# Patient Record
Sex: Male | Born: 1990 | Hispanic: Yes | Marital: Single | Smoking: Never smoker
Health system: Southern US, Community
[De-identification: ages and names within clinical notes are randomized; demographics above are authoritative.]

---

## 2020-02-20 ENCOUNTER — Other Ambulatory Visit: Payer: Self-pay

## 2020-02-20 ENCOUNTER — Ambulatory Visit
Admission: RE | Admit: 2020-02-20 | Discharge: 2020-02-20 | Disposition: A | Discharge status: Home / Self Care | Payer: Self-pay | Source: Ambulatory Visit | Attending: Emergency Medicine | Admitting: Emergency Medicine

## 2020-02-20 VITALS — BP 158/87 | HR 81 | Temp 99.0°F | Resp 18

## 2020-02-20 DIAGNOSIS — R0789 Other chest pain: Secondary | ICD-10-CM

## 2020-02-20 DIAGNOSIS — Z1152 Encounter for screening for COVID-19: Secondary | ICD-10-CM

## 2020-02-20 DIAGNOSIS — M62838 Other muscle spasm: Secondary | ICD-10-CM

## 2020-02-20 DIAGNOSIS — R739 Hyperglycemia, unspecified: Secondary | ICD-10-CM

## 2020-02-20 DIAGNOSIS — M546 Pain in thoracic spine: Secondary | ICD-10-CM

## 2020-02-20 LAB — POCT FASTING CBG KUC MANUAL ENTRY: POCT Glucose (KUC): 120 mg/dL — AB (ref 70–99)

## 2020-02-20 MED ORDER — CYCLOBENZAPRINE HCL 5 MG PO TABS: 5.0000 mg | ORAL_TABLET | Freq: Two times a day (BID) | ORAL | 0 refills | Status: AC | PRN

## 2020-02-20 MED ORDER — NAPROXEN 500 MG PO TABS: 500.0000 mg | ORAL_TABLET | Freq: Two times a day (BID) | ORAL | 0 refills | Status: AC

## 2020-02-20 NOTE — ED Provider Notes (Signed)
[facility]    CSN: [id number] Arrival date & time: 02/20/20  1844      History   Chief Complaint Chief Complaint  Patient presents with  . Cough    HPI Jorge Abbott is a 29 y.o. male  Presenting for right-sided chest and thoracic back pain.  Denies trauma, injury, numbness, neck pain, fever, bruising or rash.  Has had a mild cough with this that is nonproductive and does not affect breathing.  No palpitations, lightheadedness or dizziness.  Patient is concerned about sugars as he has a history of prediabetes and family history thereof.  No polydipsia, polyphagia, polyuria.  Denies nausea, vomiting, abdominal pain.  Has not take anything for this.  History reviewed. No pertinent past medical history.  There are no problems to display for this patient.   History reviewed. No pertinent surgical history.     Home Medications    Prior to Admission medications   Medication Sig Start Date End Date Taking? Authorizing Provider  cyclobenzaprine (FLEXERIL) 5 MG tablet Take 1 tablet (5 mg total) by mouth 2 (two) times daily as needed for up to 7 days for muscle spasms. 02/20/20 02/27/20  Hall-Potvin, [place], PA-C  naproxen (NAPROSYN) 500 MG tablet Take 1 tablet (500 mg total) by mouth 2 (two) times daily. 02/20/20   Hall-Potvin, [place], PA-C    Family History History reviewed. No pertinent family history.  Social History Social History   Tobacco Use  . Smoking status: Not on file  . Smokeless tobacco: Never Used  Substance Use Topics  . Alcohol use: Not Currently  . Drug use: Never     Allergies   Patient has no known allergies.   Review of Systems As per HPI   Physical Exam Triage Vital Signs ED Triage Vitals  Enc Vitals Group     BP      Pulse      Resp      Temp      Temp src      SpO2      Weight      Height      Head Circumference      Peak Flow      Pain Score      Pain Loc      Pain Edu?      Excl. in GC?    No data  found.  Updated Vital Signs BP (!) 158/87 (BP Location: Right Arm)   Pulse 81   Temp 99 F (37.2 C) (Oral)   Resp 18   SpO2 97%   Visual Acuity Right Eye Distance:   Left Eye Distance:   Bilateral Distance:    Right Eye Near:   Left Eye Near:    Bilateral Near:     Physical Exam Constitutional:      General: He is not in acute distress. HENT:     Head: Normocephalic and atraumatic.  Eyes:     General: No scleral icterus.    Pupils: Pupils are equal, round, and reactive to light.  Cardiovascular:     Rate and Rhythm: Normal rate.  Pulmonary:     Effort: Pulmonary effort is normal. No respiratory distress.     Breath sounds: No wheezing.  Musculoskeletal:        General: Swelling and tenderness present. Normal range of motion.     Comments: Mild swelling of right rhomboid that spares scapular winging.  No shoulder joint tenderness, chest mass or crepitus.  NVI  Skin:    Coloration: Skin is not jaundiced or pale.  Neurological:     Mental Status: He is alert and oriented to person, place, and time.      UC Treatments / Results  Labs (all labs ordered are listed, but only abnormal results are displayed) Labs Reviewed  COMPREHENSIVE METABOLIC PANEL - Abnormal; Notable for the following components:      Result Value   Glucose 105 (*)    AST 49 (*)    ALT 61 (*)    All other components within normal limits   Narrative:    Performed at:  [address] [place], [place]  [id number] Lab Director: Jolene Schimke MD, Phone:  [phone]  POCT FASTING CBG [facility] MANUAL ENTRY - Abnormal; Notable for the following components:   POCT Glucose ([facility]) 120 (*)    All other components within normal limits  NOVEL CORONAVIRUS, NAA  HEMOGLOBIN A1C   Narrative:    Performed at:  [address] [place] [address] [place], [place]  [id number] Lab Director: Jolene Schimke MD, Phone:  [phone]    EKG   Radiology No results  found.  Procedures Procedures (including critical care time)  Medications Ordered in UC Medications - No data to display  Initial Impression / Assessment and Plan / UC Course  I have reviewed the triage vital signs and the nursing notes.  Pertinent labs & imaging results that were available during my care of the patient were reviewed by me and considered in my medical decision making (see chart for details).     Patient febrile, nontoxic, and hemodynamically stable in office.  No trauma, injury, bony deformity or tenderness: Radiography deferred.  CBG 120: Patient last ate/drank 4 hours PTA.  Will obtain CMP, A1c, and have patient follow-up with PCP.  If abnormal, will start Metformin.  If normal, will continue supportive care for back and chest pain as outlined below.  Return precautions discussed, pt verbalized understanding and is agreeable to plan. Final Clinical Impressions(s) / UC Diagnoses   Final diagnoses:  Encounter for screening for COVID-19  Right-sided chest wall pain  Acute right-sided thoracic back pain  Muscle spasm  Hyperglycemia     Discharge Instructions     RICE: rest, ice, compression, elevation as needed for pain.    Heat therapy (hot compress, warm wash rag, hot showers, etc.) can help relax muscles and soothe muscle aches. Cold therapy (ice packs) can be used to help swelling both after injury and after prolonged use of areas of chronic pain/aches.  Pain medication:  500 mg Naprosyn/Aleve (naproxen) every 12 hours with food:  AVOID other NSAIDs while taking this (may have Tylenol).  May take muscle relaxer as needed for severe pain / spasm.  (This medication may cause you to become tired so it is important you do not drink alcohol or operate heavy machinery while on this medication.  Recommend your first dose to be taken before bedtime to monitor for side effects safely)  Important to follow up with specialist(s) below for further evaluation/management  if your symptoms persist or worsen.    ED Prescriptions    Medication Sig Dispense Auth. Provider   cyclobenzaprine (FLEXERIL) 5 MG tablet Take 1 tablet (5 mg total) by mouth 2 (two) times daily as needed for up to 7 days for muscle spasms. 14 tablet Hall-Potvin, [place], PA-C   naproxen (NAPROSYN) 500 MG tablet Take 1 tablet (500 mg total) by mouth 2 (two) times  daily. 30 tablet Hall-Potvin, [place], PA-C     PDMP not reviewed this encounter.   Hall-Potvin, [place], [place] 02/21/20 0845

## 2020-02-20 NOTE — Discharge Instructions (Addendum)

## 2020-02-20 NOTE — ED Triage Notes (Signed)
Pt here with cough and pain with inspiration and cough on right side starting today

## 2020-02-21 LAB — COMPREHENSIVE METABOLIC PANEL
ALT: 61 IU/L — ABNORMAL HIGH (ref 0–44)
AST: 49 IU/L — ABNORMAL HIGH (ref 0–40)
Albumin/Globulin Ratio: 1.8 (ref 1.2–2.2)
Albumin: 5.1 g/dL (ref 4.1–5.2)
Alkaline Phosphatase: 120 IU/L (ref 48–121)
BUN/Creatinine Ratio: 14 (ref 9–20)
BUN: 11 mg/dL (ref 6–20)
Bilirubin Total: 0.2 mg/dL (ref 0.0–1.2)
CO2: 27 mmol/L (ref 20–29)
Calcium: 9.3 mg/dL (ref 8.7–10.2)
Chloride: 102 mmol/L (ref 96–106)
Creatinine, Ser: 0.79 mg/dL (ref 0.76–1.27)
GFR calc Af Amer: 139 mL/min/{1.73_m2} (ref >59)
GFR calc non Af Amer: 120 mL/min/{1.73_m2} (ref >59)
Globulin, Total: 2.9 g/dL (ref 1.5–4.5)
Glucose: 105 mg/dL — ABNORMAL HIGH (ref 65–99)
Potassium: 4 mmol/L (ref 3.5–5.2)
Sodium: 140 mmol/L (ref 134–144)
Total Protein: 8 g/dL (ref 6.0–8.5)

## 2020-02-21 LAB — HEMOGLOBIN A1C
Est. average glucose Bld gHb Est-mCnc: 105 mg/dL
Hgb A1c MFr Bld: 5.3 % (ref 4.8–5.6)

## 2020-02-22 LAB — SARS-COV-2, NAA 2 DAY TAT

## 2020-02-22 LAB — NOVEL CORONAVIRUS, NAA: SARS-CoV-2, NAA: NOT DETECTED

## 2020-07-08 ENCOUNTER — Encounter: Payer: Self-pay | Admitting: *Deleted

## 2020-07-08 ENCOUNTER — Other Ambulatory Visit: Payer: Self-pay

## 2020-07-08 ENCOUNTER — Emergency Department
Admission: EM | Admit: 2020-07-08 | Discharge: 2020-07-09 | Disposition: A | Discharge status: Home / Self Care | Payer: No Typology Code available for payment source | Attending: Emergency Medicine | Admitting: Emergency Medicine

## 2020-07-08 ENCOUNTER — Emergency Department: Payer: No Typology Code available for payment source

## 2020-07-08 DIAGNOSIS — S39012A Strain of muscle, fascia and tendon of lower back, initial encounter: Secondary | ICD-10-CM | POA: Insufficient documentation

## 2020-07-08 DIAGNOSIS — Y92410 Unspecified street and highway as the place of occurrence of the external cause: Secondary | ICD-10-CM | POA: Diagnosis not present

## 2020-07-08 DIAGNOSIS — S3992XA Unspecified injury of lower back, initial encounter: Secondary | ICD-10-CM | POA: Diagnosis present

## 2020-07-08 MED ORDER — MELOXICAM 15 MG PO TABS: 15.0000 mg | ORAL_TABLET | Freq: Every day | ORAL | 0 refills | Status: AC

## 2020-07-08 MED ORDER — MELOXICAM 7.5 MG PO TABS
15.0000 mg | ORAL_TABLET | Freq: Once | ORAL | Status: AC
  Administered 2020-07-09: 15 mg via ORAL
  Filled 2020-07-08: qty 2

## 2020-07-08 MED ORDER — METHOCARBAMOL 500 MG PO TABS
750.0000 mg | ORAL_TABLET | Freq: Once | ORAL | Status: AC
  Administered 2020-07-09: 750 mg via ORAL
  Filled 2020-07-08: qty 2

## 2020-07-08 MED ORDER — ACETAMINOPHEN 325 MG PO TABS
650.0000 mg | ORAL_TABLET | Freq: Once | ORAL | Status: AC
  Administered 2020-07-09: 650 mg via ORAL
  Filled 2020-07-08: qty 2

## 2020-07-08 MED ORDER — METHOCARBAMOL 750 MG PO TABS: 750.0000 mg | ORAL_TABLET | Freq: Four times a day (QID) | ORAL | 0 refills | Status: AC | PRN
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
## 2020-09-07 ENCOUNTER — Emergency Department: Payer: Self-pay

## 2020-09-07 ENCOUNTER — Other Ambulatory Visit: Payer: Self-pay

## 2020-09-07 ENCOUNTER — Encounter: Payer: Self-pay | Admitting: Radiology

## 2020-09-07 DIAGNOSIS — R0789 Other chest pain: Secondary | ICD-10-CM | POA: Insufficient documentation

## 2020-09-07 LAB — CBC
HCT: 44.5 % (ref 39.0–52.0)
Hemoglobin: 16.1 g/dL (ref 13.0–17.0)
MCH: 31.9 pg (ref 26.0–34.0)
MCHC: 36.2 g/dL — ABNORMAL HIGH (ref 30.0–36.0)
MCV: 88.3 fL (ref 80.0–100.0)
Platelets: 191 10*3/uL (ref 150–400)
RBC: 5.04 MIL/uL (ref 4.22–5.81)
RDW: 11.5 % (ref 11.5–15.5)
WBC: 8.6 10*3/uL (ref 4.0–10.5)
nRBC: 0 % (ref 0.0–0.2)

## 2020-09-07 NOTE — ED Notes (Signed)
FIRST NURSE NOTE: ambulatory to desk c/o chest pain. Pt alert and oriented X4, cooperative, RR even and unlabored, color WNL. Pt in NAD.

## 2020-09-07 NOTE — ED Triage Notes (Addendum)
Pt states was lying down tonight when he began to experience chest pain. Pt states is on left side. Pt with nausea, shortness of breath. Pt denies pain radiation. Assist of stratus spanish interpreter used [phone] [employer]

## 2020-09-08 ENCOUNTER — Emergency Department
Admission: EM | Admit: 2020-09-08 | Discharge: 2020-09-08 | Disposition: A | Discharge status: Home / Self Care | Payer: Self-pay | Attending: Emergency Medicine | Admitting: Emergency Medicine

## 2020-09-08 DIAGNOSIS — R079 Chest pain, unspecified: Secondary | ICD-10-CM

## 2020-09-08 LAB — LIPID PANEL
Cholesterol: 205 mg/dL — ABNORMAL HIGH (ref 0–200)
HDL: 58 mg/dL (ref >40)
LDL Cholesterol: 133 mg/dL — ABNORMAL HIGH (ref 0–99)
Total CHOL/HDL Ratio: 3.5 RATIO
Triglycerides: 72 mg/dL (ref <150)
VLDL: 14 mg/dL (ref 0–40)

## 2020-09-08 LAB — BASIC METABOLIC PANEL
Anion gap: 11 (ref 5–15)
BUN: 17 mg/dL (ref 6–20)
CO2: 22 mmol/L (ref 22–32)
Calcium: 9.4 mg/dL (ref 8.9–10.3)
Chloride: 106 mmol/L (ref 98–111)
Creatinine, Ser: 0.76 mg/dL (ref 0.61–1.24)
GFR, Estimated: >60 mL/min (ref >60)
Glucose, Bld: 101 mg/dL — ABNORMAL HIGH (ref 70–99)
Potassium: 3.6 mmol/L (ref 3.5–5.1)
Sodium: 139 mmol/L (ref 135–145)

## 2020-09-08 LAB — TROPONIN I (HIGH SENSITIVITY)
Troponin I (High Sensitivity): 4 ng/L (ref <18)
Troponin I (High Sensitivity): 4 ng/L (ref <18)

## 2020-09-08 MED ORDER — ONDANSETRON 4 MG PO TBDP: 4.0000 mg | ORAL_TABLET | Freq: Three times a day (TID) | ORAL | 0 refills | Status: DC | PRN

## 2020-09-08 MED ORDER — FAMOTIDINE 20 MG PO TABS: 20.0000 mg | ORAL_TABLET | Freq: Two times a day (BID) | ORAL | 0 refills | Status: AC

## 2020-09-08 NOTE — ED Provider Notes (Signed)
[facility] Emergency Department Provider Note  ____________________________________________  Time seen: Approximately 3:23 AM  I have reviewed the triage vital signs and the nursing notes.   HISTORY  Chief Complaint Chest Pain  Patient is primarily Spanish-speaking.  Friend at bedside provided interpretation at patient's request.  HPI Jorge Abbott is a 30 y.o. male with no significant past medical history who comes ED complaining of chest pain  started about 9:00 PM tonight.  Occurred while at rest lying down, located on the left side, nonradiating.  No diaphoresis vomiting or shortness of breath.  Not exertional, not pleuritic.  Intermittent lasting a few seconds at a time.  Friend at bedside notes that they eat a lot of spicy food.     History reviewed. No pertinent past medical history.   There are no problems to display for this patient.    Past surgical history negative   Prior to Admission medications   Medication Sig Start Date End Date Taking? Authorizing Provider  famotidine (PEPCID) 20 MG tablet Take 1 tablet (20 mg total) by mouth 2 (two) times daily. 09/08/20  Yes Sharman Cheek, MD  ondansetron (ZOFRAN ODT) 4 MG disintegrating tablet Take 1 tablet (4 mg total) by mouth every 8 (eight) hours as needed for nausea or vomiting. 09/08/20  Yes Sharman Cheek, MD  naproxen (NAPROSYN) 500 MG tablet Take 1 tablet (500 mg total) by mouth 2 (two) times daily. 02/20/20   Hall-Potvin, [place], PA-C     Allergies Patient has no known allergies.   No family history on file.  Social History Social History   Tobacco Use  . Smoking status: Never Smoker  . Smokeless tobacco: Never Used  Substance Use Topics  . Alcohol use: Not Currently  . Drug use: Never    Review of Systems  Constitutional:   No fever or chills.  ENT:   No sore throat. No rhinorrhea. Cardiovascular: Positive chest pain as above without syncope. Respiratory:   No  dyspnea or cough. Gastrointestinal:   Negative for abdominal pain, vomiting and diarrhea.  Musculoskeletal:   Negative for focal pain or swelling All other systems reviewed and are negative except as documented above in ROS and HPI.  ____________________________________________   PHYSICAL EXAM:  VITAL SIGNS: ED Triage Vitals [09/07/20 2327]  Enc Vitals Group     BP (!) 144/90     Pulse Rate 86     Resp 16     Temp 97.9 F (36.6 C)     Temp Source Oral     SpO2 100 %     Weight 130 lb (59 kg)     Height 5\' 5"  (1.651 m)     Head Circumference      Peak Flow      Pain Score 6     Pain Loc      Pain Edu?      Excl. in GC?     Vital signs reviewed, nursing assessments reviewed.   Constitutional:   Alert and oriented. Non-toxic appearance. Eyes:   Conjunctivae are normal. EOMI. PERRL. ENT      Head:   Normocephalic and atraumatic.      Nose:   Wearing a mask.      Mouth/Throat:   Wearing a mask.      Neck:   No meningismus. Full ROM. Hematological/Lymphatic/Immunilogical:   No cervical lymphadenopathy. Cardiovascular:   RRR. Symmetric bilateral radial and DP pulses.  No murmurs. Cap refill less than 2 seconds. Respiratory:  Normal respiratory effort without tachypnea/retractions. Breath sounds are clear and equal bilaterally. No wheezes/rales/rhonchi. Gastrointestinal:   Soft and nontender. Non distended. There is no CVA tenderness.  No rebound, rigidity, or guarding. Genitourinary:   deferred Musculoskeletal:   Normal range of motion in all extremities. No joint effusions.  No lower extremity tenderness.  No edema. Neurologic:   Normal speech and language.  Motor grossly intact. No acute focal neurologic deficits are appreciated.  Skin:    Skin is warm, dry and intact. No rash noted.  No petechiae, purpura, or bullae.  ____________________________________________    LABS (pertinent positives/negatives) (all labs ordered are listed, but only abnormal results are  displayed) Labs Reviewed  BASIC METABOLIC PANEL - Abnormal; Notable for the following components:      Result Value   Glucose, Bld 101 (*)    All other components within normal limits  CBC - Abnormal; Notable for the following components:   MCHC 36.2 (*)    All other components within normal limits  LIPID PANEL - Abnormal; Notable for the following components:   Cholesterol 205 (*)    LDL Cholesterol 133 (*)    All other components within normal limits  TROPONIN I (HIGH SENSITIVITY)  TROPONIN I (HIGH SENSITIVITY)   ____________________________________________   EKG  Interpreted by me Normal sinus rhythm rate of 89, normal axis and intervals.  Normal QRS ST segments and T waves.  ____________________________________________    RADIOLOGY  DG Chest 2 View  Result Date: 09/07/2020 CLINICAL DATA:  Chest pain.  Nausea and shortness of breath. EXAM: CHEST - 2 VIEW COMPARISON:  None. FINDINGS: The cardiomediastinal contours are normal. The lungs are clear. Pulmonary vasculature is normal. No consolidation, pleural effusion, or pneumothorax. No acute osseous abnormalities are seen. IMPRESSION: Negative radiographs of the chest. Electronically Signed   By: Narda Rutherford M.D.   On: 09/07/2020 23:57    ____________________________________________   PROCEDURES Procedures  ____________________________________________    CLINICAL IMPRESSION / ASSESSMENT AND PLAN / ED COURSE  Medications ordered in the ED: Medications - No data to display  Pertinent labs & imaging results that were available during my care of the patient were reviewed by me and considered in my medical decision making (see chart for details).  Jorge Abbott was evaluated in Emergency Department on 09/08/2020 for the symptoms described in the history of present illness. He was evaluated in the context of the global COVID-19 pandemic, which necessitated consideration that the patient might be at risk for infection  with the SARS-CoV-2 virus that causes COVID-19. Institutional protocols and algorithms that pertain to the evaluation of patients at risk for COVID-19 are in a state of rapid change based on information released by regulatory bodies including the CDC and federal and state organizations. These policies and algorithms were followed during the patient's care in the ED.   Patient presents with atypical chest pain.  Suspect GERD.  He is nontoxic, EKG and vital signs are normal.  Labs normal with troponin negative x2, exam benign.  Stable for discharge home with trial of Pepcid.   Considering the patient's symptoms, medical history, and physical examination today, I have low suspicion for ACS, PE, TAD, pneumothorax, carditis, mediastinitis, pneumonia, CHF, or sepsis.        ____________________________________________   FINAL CLINICAL IMPRESSION(S) / ED DIAGNOSES    Final diagnoses:  Nonspecific chest pain     ED Discharge Orders         Ordered    famotidine (PEPCID)  20 MG tablet  2 times daily        09/08/20 0322    ondansetron (ZOFRAN ODT) 4 MG disintegrating tablet  Every 8 hours PRN        09/08/20 0322          Portions of this note were generated with dragon dictation software. Dictation errors may occur despite best attempts at proofreading.   Sharman Cheek, MD 09/08/20 [phone]

## 2020-10-04 ENCOUNTER — Ambulatory Visit: Payer: Self-pay

## 2021-01-10 ENCOUNTER — Ambulatory Visit (INDEPENDENT_AMBULATORY_CARE_PROVIDER_SITE_OTHER): Payer: Self-pay

## 2021-01-10 ENCOUNTER — Ambulatory Visit (HOSPITAL_COMMUNITY)
Admission: EM | Admit: 2021-01-10 | Discharge: 2021-01-10 | Disposition: A | Discharge status: Home / Self Care | Payer: Self-pay | Attending: Internal Medicine | Admitting: Internal Medicine

## 2021-01-10 ENCOUNTER — Encounter (HOSPITAL_COMMUNITY): Payer: Self-pay | Admitting: *Deleted

## 2021-01-10 ENCOUNTER — Other Ambulatory Visit: Payer: Self-pay

## 2021-01-10 DIAGNOSIS — F1010 Alcohol abuse, uncomplicated: Secondary | ICD-10-CM | POA: Insufficient documentation

## 2021-01-10 DIAGNOSIS — Z20822 Contact with and (suspected) exposure to covid-19: Secondary | ICD-10-CM | POA: Insufficient documentation

## 2021-01-10 DIAGNOSIS — R0602 Shortness of breath: Secondary | ICD-10-CM | POA: Insufficient documentation

## 2021-01-10 DIAGNOSIS — R079 Chest pain, unspecified: Secondary | ICD-10-CM

## 2021-01-10 DIAGNOSIS — R0789 Other chest pain: Secondary | ICD-10-CM | POA: Insufficient documentation

## 2021-01-10 DIAGNOSIS — F1490 Cocaine use, unspecified, uncomplicated: Secondary | ICD-10-CM | POA: Insufficient documentation

## 2021-01-10 LAB — SARS CORONAVIRUS 2 (TAT 6-24 HRS): SARS Coronavirus 2: NEGATIVE

## 2021-01-10 LAB — COMPREHENSIVE METABOLIC PANEL
ALT: 62 U/L — ABNORMAL HIGH (ref 0–44)
AST: 46 U/L — ABNORMAL HIGH (ref 15–41)
Albumin: 4.3 g/dL (ref 3.5–5.0)
Alkaline Phosphatase: 91 U/L (ref 38–126)
Anion gap: 7 (ref 5–15)
BUN: 11 mg/dL (ref 6–20)
CO2: 28 mmol/L (ref 22–32)
Calcium: 9.1 mg/dL (ref 8.9–10.3)
Chloride: 105 mmol/L (ref 98–111)
Creatinine, Ser: 0.78 mg/dL (ref 0.61–1.24)
GFR, Estimated: >60 mL/min (ref >60)
Glucose, Bld: 141 mg/dL — ABNORMAL HIGH (ref 70–99)
Potassium: 3.3 mmol/L — ABNORMAL LOW (ref 3.5–5.1)
Sodium: 140 mmol/L (ref 135–145)
Total Bilirubin: 0.8 mg/dL (ref 0.3–1.2)
Total Protein: 7.5 g/dL (ref 6.5–8.1)

## 2021-01-10 LAB — D-DIMER, QUANTITATIVE: D-Dimer, Quant: 0.43 ug/mL-FEU (ref 0.00–0.50)

## 2021-01-10 MED ORDER — ACETAMINOPHEN 500 MG PO TABS: 500.0000 mg | ORAL_TABLET | Freq: Four times a day (QID) | ORAL | 0 refills | Status: AC | PRN

## 2021-01-10 MED ORDER — TIZANIDINE HCL 4 MG PO TABS: 4.0000 mg | ORAL_TABLET | Freq: Three times a day (TID) | ORAL | 0 refills | Status: DC | PRN

## 2021-01-10 NOTE — ED Triage Notes (Signed)
Pt reports he will be sound asleep and jump during his sleep. Pt also reports he sees green spots on his Chest and has had CP for last 2 days.Marland Kitchen

## 2021-01-10 NOTE — ED Provider Notes (Signed)
[facility]   MRN: [id number] DOB: 1990-12-08  Subjective:   Jorge Abbott is a 30 y.o. male presenting for 1 week history of persistent intermittent shortness of breath, 4 day history of alternating right and left sided chest pain.  Denies fever, nausea, vomiting, cough, abdominal pain, body aches, chills.  Patient drinks heavily 1 to 2 days out of the week.  Uses cocaine intermittently, last use was 3 weeks ago.  Denies history of respiratory disorders such as asthma or COPD.  Denies taking chronic medications.    No Known Allergies  History reviewed. No pertinent past medical history.   History reviewed. No pertinent surgical history.  History reviewed. No pertinent family history.  Social History   Tobacco Use   Smoking status: Never   Smokeless tobacco: Never  Substance Use Topics   Alcohol use: Not Currently   Drug use: Never    ROS   Objective:   Vitals: BP 133/84   Pulse 89   Temp 98.6 F (37 C) (Oral)   Resp 18   SpO2 94%   Physical Exam Constitutional:      General: He is not in acute distress.    Appearance: Normal appearance. He is well-developed. He is not ill-appearing, toxic-appearing or diaphoretic.  HENT:     Head: Normocephalic and atraumatic.     Right Ear: External ear normal.     Left Ear: External ear normal.     Nose: Nose normal.     Mouth/Throat:     Mouth: Mucous membranes are moist.     Pharynx: Oropharynx is clear.  Eyes:     General: No scleral icterus.    Extraocular Movements: Extraocular movements intact.     Pupils: Pupils are equal, round, and reactive to light.  Cardiovascular:     Rate and Rhythm: Normal rate and regular rhythm.     Heart sounds: Normal heart sounds. No murmur heard.   No friction rub. No gallop.  Pulmonary:     Effort: Pulmonary effort is normal. No respiratory distress.     Breath sounds: Normal breath sounds. No stridor. No wheezing, rhonchi or rales.  Neurological:      Mental Status: He is alert and oriented to person, place, and time.  Psychiatric:        Mood and Affect: Mood normal.        Behavior: Behavior normal.        Thought Content: Thought content normal.    ED ECG REPORT   Date: 01/10/2021  Rate: 77bpm  Rhythm: normal sinus rhythm  QRS Axis: normal  Intervals: normal  ST/T Wave abnormalities: nonspecific T wave changes  Conduction Disutrbances:none  Narrative Interpretation: Sinus rhythm at 77 bpm with nonspecific T wave inversion in lead III, T wave flattening in lead aVF.  Unchanged from previous EKG 09/08/2020.  Old EKG Reviewed: unchanged  I have personally reviewed the EKG tracing and agree with the computerized printout as noted.  DG Chest 2 View  Result Date: 01/10/2021 CLINICAL DATA:  Chest pain EXAM: CHEST - 2 VIEW COMPARISON:  09/07/2020 FINDINGS: The heart size and mediastinal contours are within normal limits. Both lungs are clear. The visualized skeletal structures are unremarkable. IMPRESSION: No active cardiopulmonary disease. Electronically Signed   By: Charlett Nose M.D.   On: 01/10/2021 12:43     Assessment and Plan :   PDMP not reviewed this encounter.  1. Atypical chest pain   2. Shortness of breath  3. Alcohol consumption binge drinking   4. Cocaine use     Patient is Wells criteria is low but ultimately he does have chest pain and shortness of breath and will pursue D-dimer.  Counseled him against binge drinking alcohol.  Also counseled against drug use.  Labs pending, recommended starting Tylenol and tizanidine for musculoskeletal type pain.  We will follow-up results as soon as possible. Counseled patient on potential for adverse effects with medications prescribed/recommended today, ER and return-to-clinic precautions discussed, patient verbalized understanding.    Wallis Bamberg, PA-C 01/10/21 1258
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
## 2021-09-05 ENCOUNTER — Other Ambulatory Visit: Payer: Self-pay

## 2021-09-05 ENCOUNTER — Ambulatory Visit
Admission: RE | Admit: 2021-09-05 | Discharge: 2021-09-05 | Disposition: A | Discharge status: Home / Self Care | Payer: Self-pay | Source: Ambulatory Visit | Attending: Physician Assistant | Admitting: Physician Assistant

## 2021-09-05 VITALS — BP 137/84 | HR 66 | Temp 97.9°F | Resp 18

## 2021-09-05 DIAGNOSIS — R079 Chest pain, unspecified: Secondary | ICD-10-CM

## 2021-09-05 MED ORDER — CYCLOBENZAPRINE HCL 10 MG PO TABS: 10.0000 mg | ORAL_TABLET | Freq: Two times a day (BID) | ORAL | 0 refills | Status: AC | PRN

## 2021-09-05 MED ORDER — OMEPRAZOLE 20 MG PO CPDR: 20.0000 mg | DELAYED_RELEASE_CAPSULE | Freq: Every day | ORAL | 0 refills | Status: AC

## 2021-09-05 NOTE — ED Triage Notes (Signed)
3 day h/o intermittent "stabbing" left sided chest pain. Pt reports when the pain occurs its feels more like pressure. Pain last for like half a second before resolving independently. Pain occurs about once every hr. ?CP does not radiate into extremities. No meds taken.  ?

## 2021-09-05 NOTE — Discharge Instructions (Addendum)
?  Please make appointment to establish care with Primary Care Provider on MyChart. ?

## 2021-09-05 NOTE — ED Provider Notes (Signed)
?[facility] ? ? ? ?CSN: [id number] ?Arrival date & time: 09/05/21  1401 ? ? ?  ? ?History   ?Chief Complaint ?Chief Complaint  ?Patient presents with  ? 2p appt  ? Chest Pain  ? ? ?HPI ?Jorge Abbott is a 31 y.o. male.  ? ?Patient here today for evaluation of left sided chest pain that he describes as stabbing that occurs for a half second then resolves. Seems to occur about once every hour. Not worsened with deep breathing or any other activity. No shortness of breath other than the split second he has pain. He denise any nausea or vomiting. He has not tried any treatment for symptoms. He requests blood work as he has been told he was a pre-diabetic in the past.  ? ?The history is provided by the patient. A language interpreter was used Forensic psychologist- [employer]).  ?Chest Pain ?Associated symptoms: no fever, no nausea, no numbness, no shortness of breath and no vomiting   ? ?History reviewed. No pertinent past medical history. ? ?There are no problems to display for this patient. ? ? ?History reviewed. No pertinent surgical history. ? ? ? ? ?Home Medications   ? ?Prior to Admission medications   ?Medication Sig Start Date End Date Taking? Authorizing Provider  ?cyclobenzaprine (FLEXERIL) 10 MG tablet Take 1 tablet (10 mg total) by mouth 2 (two) times daily as needed for muscle spasms. 09/05/21  Yes Tomi Bamberger, PA-C  ?omeprazole (PRILOSEC) 20 MG capsule Take 1 capsule (20 mg total) by mouth daily. 09/05/21  Yes Tomi Bamberger, PA-C  ?acetaminophen (TYLENOL) 500 MG tablet Take 1 tablet (500 mg total) by mouth every 6 (six) hours as needed. 01/10/21   Wallis Bamberg, PA-C  ?famotidine (PEPCID) 20 MG tablet Take 1 tablet (20 mg total) by mouth 2 (two) times daily. 09/08/20   Sharman Cheek, MD  ?naproxen (NAPROSYN) 500 MG tablet Take 1 tablet (500 mg total) by mouth 2 (two) times daily. 02/20/20   Hall-Potvin, [place], PA-C  ?ondansetron (ZOFRAN ODT) 4 MG disintegrating tablet Take 1 tablet (4 mg total) by  mouth every 8 (eight) hours as needed for nausea or vomiting. 09/08/20   Sharman Cheek, MD  ? ? ?Family History ?History reviewed. No pertinent family history. ? ?Social History ?Social History  ? ?Tobacco Use  ? Smoking status: Never  ? Smokeless tobacco: Never  ?Substance Use Topics  ? Alcohol use: Not Currently  ? Drug use: Never  ? ? ? ?Allergies   ?Patient has no known allergies. ? ? ?Review of Systems ?Review of Systems  ?Constitutional:  Negative for chills and fever.  ?Eyes:  Negative for discharge and redness.  ?Respiratory:  Negative for shortness of breath.   ?Cardiovascular:  Positive for chest pain.  ?Gastrointestinal:  Negative for nausea and vomiting.  ?Neurological:  Negative for numbness.  ? ? ?Physical Exam ?Triage Vital Signs ?ED Triage Vitals  ?Enc Vitals Group  ?   BP 09/05/21 1426 137/84  ?   Pulse Rate 09/05/21 1426 66  ?   Resp 09/05/21 1426 18  ?   Temp 09/05/21 1426 97.9 ?F (36.6 ?C)  ?   Temp Source 09/05/21 1426 Oral  ?   SpO2 09/05/21 1426 98 %  ?   Weight --   ?   Height --   ?   Head Circumference --   ?   Peak Flow --   ?   Pain Score 09/05/21 1424 0  ?  Pain Loc --   ?   Pain Edu? --   ?   Excl. in [place]? --   ? ?No data found. ? ?Updated Vital Signs ?BP 137/84 (BP Location: Left Arm)   Pulse 66   Temp 97.9 ?F (36.6 ?C) (Oral)   Resp 18   SpO2 98%  ? ?   ? ?Physical Exam ?Vitals and nursing note reviewed.  ?Constitutional:   ?   General: He is not in acute distress. ?   Appearance: Normal appearance. He is not ill-appearing.  ?HENT:  ?   Head: Normocephalic and atraumatic.  ?Eyes:  ?   Conjunctiva/sclera: Conjunctivae normal.  ?Cardiovascular:  ?   Rate and Rhythm: Normal rate and regular rhythm.  ?   Heart sounds: Normal heart sounds. No murmur heard. ?   Comments: Chest nontender on exam ?Pulmonary:  ?   Effort: Pulmonary effort is normal. No respiratory distress.  ?   Breath sounds: Normal breath sounds. No wheezing, rhonchi or rales.  ?Neurological:  ?   Mental Status: He is  alert.  ?Psychiatric:     ?   Mood and Affect: Mood normal.     ?   Behavior: Behavior normal.     ?   Thought Content: Thought content normal.  ? ? ? ?UC Treatments / Results  ?Labs ?(all labs ordered are listed, but only abnormal results are displayed) ?Labs Reviewed  ?BASIC METABOLIC PANEL  ? ? ?EKG ? ? ?Radiology ?No results found. ? ?Procedures ?Procedures (including critical care time) ? ?Medications Ordered in UC ?Medications - No data to display ? ?Initial Impression / Assessment and Plan / UC Course  ?I have reviewed the triage vital signs and the nursing notes. ? ?Pertinent labs & imaging results that were available during my care of the patient were reviewed by me and considered in my medical decision making (see chart for details). ? ?  ?Will trial treatment with muscle relaxer and omeprazole. Strongly recommended further evaluation with primary care which patient reports he does not currently have. Discussed using mychart to schedule appointment. Encouraged follow up with any further concerns.  ? ?Final Clinical Impressions(s) / UC Diagnoses  ? ?Final diagnoses:  ?Chest pain, unspecified type  ? ? ? ?Discharge Instructions   ? ?  ? ?Please make appointment to establish care with Primary Care Provider on MyChart. ? ? ? ? ?ED Prescriptions   ? ? Medication Sig Dispense Auth. Provider  ? omeprazole (PRILOSEC) 20 MG capsule Take 1 capsule (20 mg total) by mouth daily. 60 capsule Erma Pinto F, PA-C  ? cyclobenzaprine (FLEXERIL) 10 MG tablet Take 1 tablet (10 mg total) by mouth 2 (two) times daily as needed for muscle spasms. 20 tablet Tomi Bamberger, PA-C  ? ?  ? ?PDMP not reviewed this encounter. ?  ?Tomi Bamberger, PA-C ?09/05/21 1452 ? ?

## 2021-09-06 LAB — BASIC METABOLIC PANEL
BUN/Creatinine Ratio: 15 (ref 9–20)
BUN: 13 mg/dL (ref 6–20)
CO2: 22 mmol/L (ref 20–29)
Calcium: 9.5 mg/dL (ref 8.7–10.2)
Chloride: 107 mmol/L — ABNORMAL HIGH (ref 96–106)
Creatinine, Ser: 0.85 mg/dL (ref 0.76–1.27)
Glucose: 99 mg/dL (ref 70–99)
Potassium: 4 mmol/L (ref 3.5–5.2)
Sodium: 143 mmol/L (ref 134–144)
eGFR: 119 mL/min/{1.73_m2} (ref >59)

## 2022-08-24 ENCOUNTER — Ambulatory Visit
Admission: EM | Admit: 2022-08-24 | Discharge: 2022-08-24 | Disposition: A | Discharge status: Home / Self Care | Payer: Self-pay | Attending: Family Medicine | Admitting: Family Medicine

## 2022-08-24 ENCOUNTER — Encounter: Payer: Self-pay | Admitting: Emergency Medicine

## 2022-08-24 DIAGNOSIS — A084 Viral intestinal infection, unspecified: Secondary | ICD-10-CM

## 2022-08-24 MED ORDER — ONDANSETRON 4 MG PO TBDP: 4.0000 mg | ORAL_TABLET | Freq: Three times a day (TID) | ORAL | 0 refills | Status: AC | PRN

## 2022-08-24 MED ORDER — MELOXICAM 7.5 MG PO TABS: 7.5000 mg | ORAL_TABLET | Freq: Every day | ORAL | 0 refills | Status: AC

## 2022-08-24 MED ORDER — LOPERAMIDE HCL 2 MG PO CAPS: 2.0000 mg | ORAL_CAPSULE | Freq: Four times a day (QID) | ORAL | 0 refills | Status: AC | PRN

## 2022-08-24 NOTE — Discharge Instructions (Addendum)
Your symptoms are most likely caused by a virus, it will work its way out your system over the next few days  You can use zofran every 8 hours as needed for nausea, be mindful this medication may make you drowsy, take the first dose at home to see how it affects your body  You can use Imodium every 6 hours to help with diarrhea, and be mindful over use of this medication may cause opposite effect constipation  Lo ms probable es que sus sntomas sean causados por un virus, que saldr de [employer].  Puede usar zofran cada 8 horas segn sea necesario para las nuseas, tenga en cuenta que este medicamento puede causarle somnolencia, tome la primera dosis en casa para ver cmo afecta su cuerpo.  Puede usar Imodium cada 6 horas para ayudar con la diarrea y tenga en cuenta que el uso excesivo de este medicamento puede causar estreimiento por el efecto contrario.  Puede tomar meloxicam una vez al da para Best boy; puede tomarlo adems de tylenol.  Contine promoviendo la hidratacin durante todo [employer] utilizando una solucin de reemplazo de electrolitos como Gatorade, chalecos antibalas, Pedialyte, cualquiera que tenga en casa.  Intente comer alimentos suaves como pan, arroz, tostadas, frutas que sean ms fciles de digerir para el estmago, evite los alimentos demasiado picantes, demasiado condimentados o grasosos.   You can meloxicam once a day to help with pain, may take this in addition to tylenol   Continue to promote hydration throughout the day by using electrolyte replacement solution such as Gatorade, body armor, Pedialyte, which ever you have at home  Try eating bland foods such as bread, rice, toast, fruit which are easier on the stomach to digest, avoid foods that are overly spicy, overly seasoned or greasy

## 2022-08-24 NOTE — ED Provider Notes (Signed)
[facility]    CSN: [id number] Arrival date & time: 08/24/22  [id number]      History   Chief Complaint Chief Complaint  Patient presents with   Emesis   Fever   Diarrhea    HPI Jorge Abbott is a 32 y.o. male.   Patient presents for evaluation of fever, sore throat, generalized abdominal pain, diarrhea, vomiting and bodyaches present for 4 days.  Last occurrence of vomiting this morning.  Last occurrence of diarrhea this morning described as watery.  Experience subjective fevers.  Decreased appetite due to consumption eliciting pain from the abdomen, but has been able to tolerate some food.  No known sick contact prior.  Has attempted use of Tylenol which has been effective.  Denies congestion, ear pain, cough .   Declined formal Spanish interpreter, translation completed by family/friend  History reviewed. No pertinent past medical history.  There are no problems to display for this patient.   History reviewed. No pertinent surgical history.     Home Medications    Prior to Admission medications   Medication Sig Start Date End Date Taking? Authorizing Provider  acetaminophen (TYLENOL) 500 MG tablet Take 1 tablet (500 mg total) by mouth every 6 (six) hours as needed. 01/10/21   Jaynee Eagles, PA-C  cyclobenzaprine (FLEXERIL) 10 MG tablet Take 1 tablet (10 mg total) by mouth 2 (two) times daily as needed for muscle spasms. 09/05/21   Francene Finders, PA-C  famotidine (PEPCID) 20 MG tablet Take 1 tablet (20 mg total) by mouth 2 (two) times daily. 09/08/20   Carrie Mew, MD  naproxen (NAPROSYN) 500 MG tablet Take 1 tablet (500 mg total) by mouth 2 (two) times daily. 02/20/20   Hall-Potvin, [place], PA-C  omeprazole (PRILOSEC) 20 MG capsule Take 1 capsule (20 mg total) by mouth daily. 09/05/21   Francene Finders, PA-C  ondansetron (ZOFRAN ODT) 4 MG disintegrating tablet Take 1 tablet (4 mg total) by mouth every 8 (eight) hours as needed for nausea or vomiting.  09/08/20   Carrie Mew, MD    Family History No family history on file.  Social History Social History   Tobacco Use   Smoking status: Never   Smokeless tobacco: Never  Vaping Use   Vaping Use: Never used  Substance Use Topics   Alcohol use: Yes   Drug use: Never     Allergies   Patient has no known allergies.   Review of Systems Review of Systems  Constitutional:  Positive for fever. Negative for activity change, appetite change, chills, diaphoresis, fatigue and unexpected weight change.  HENT:  Positive for sore throat. Negative for congestion, dental problem, drooling, ear discharge, ear pain, facial swelling, hearing loss, mouth sores, nosebleeds, postnasal drip, rhinorrhea, sinus pressure, sinus pain, sneezing, tinnitus, trouble swallowing and voice change.   Respiratory: Negative.    Cardiovascular: Negative.   Gastrointestinal:  Positive for abdominal pain, diarrhea and vomiting. Negative for abdominal distention, anal bleeding, blood in stool, constipation, nausea and rectal pain.  Musculoskeletal:  Positive for myalgias. Negative for arthralgias, back pain, gait problem, joint swelling, neck pain and neck stiffness.  Skin: Negative.      Physical Exam Triage Vital Signs ED Triage Vitals  Enc Vitals Group     BP 08/24/22 0949 122/88     Pulse Rate 08/24/22 0949 84     Resp 08/24/22 0949 16     Temp 08/24/22 0949 98.2 F (36.8 C)     Temp Source  08/24/22 0949 Oral     SpO2 08/24/22 0949 100 %     Weight 08/24/22 0948 130 lb 1.1 oz (59 kg)     Height 08/24/22 0948 5' 5"$  (1.651 m)     Head Circumference --      Peak Flow --      Pain Score 08/24/22 0947 0     Pain Loc --      Pain Edu? --      Excl. in [place]? --    No data found.  Updated Vital Signs BP 122/88 (BP Location: Left Arm)   Pulse 84   Temp 98.2 F (36.8 C) (Oral)   Resp 16   Ht 5' 5"$  (1.651 m)   Wt 130 lb 1.1 oz (59 kg)   SpO2 100%   BMI 21.64 kg/m   Visual Acuity Right Eye  Distance:   Left Eye Distance:   Bilateral Distance:    Right Eye Near:   Left Eye Near:    Bilateral Near:     Physical Exam Constitutional:      Appearance: Normal appearance.  HENT:     Right Ear: Tympanic membrane, ear canal and external ear normal.     Left Ear: Tympanic membrane, ear canal and external ear normal.     Nose: Nose normal.     Mouth/Throat:     Mouth: Mucous membranes are moist.     Pharynx: No posterior oropharyngeal erythema.  Cardiovascular:     Rate and Rhythm: Normal rate and regular rhythm.     Pulses: Normal pulses.     Heart sounds: Normal heart sounds.  Pulmonary:     Effort: Pulmonary effort is normal.     Breath sounds: Normal breath sounds.  Abdominal:     General: Bowel sounds are normal.     Palpations: Abdomen is soft.     Tenderness: There is generalized abdominal tenderness.  Skin:    General: Skin is warm and dry.  Neurological:     Mental Status: He is alert and oriented to person, place, and time. Mental status is at baseline.  Psychiatric:        Mood and Affect: Mood normal.        Behavior: Behavior normal.      UC Treatments / Results  Labs (all labs ordered are listed, but only abnormal results are displayed) Labs Reviewed - No data to display  EKG   Radiology No results found.  Procedures Procedures (including critical care time)  Medications Ordered in UC Medications - No data to display  Initial Impression / Assessment and Plan / UC Course  I have reviewed the triage vital signs and the nursing notes.  Pertinent labs & imaging results that were available during my care of the patient were reviewed by me and considered in my medical decision making (see chart for details).  Viral gastroenteritis  Medicine stable patient is in no signs of distress nontoxic-appearing, tenderness is generalized to the abdomen, low suspicion for more acute cough, etiology is most likely viral, discussed with patient, prescribe  Zofran and Imodium for outpatient use and advised increase fluid intake until symptoms have resolved with food as tolerated, recommended a bland diet, may follow-up with urgent care as needed if symptoms persist or worsen Final Clinical Impressions(s) / UC Diagnoses   Final diagnoses:  None   Discharge Instructions   None    ED Prescriptions   None    PDMP not reviewed this encounter.  Hans Eden, NP 08/24/22 1014

## 2022-08-24 NOTE — ED Triage Notes (Signed)
Pt c/o diarrhea, vomiting, body aches, and fever. Started about 4 days ago.
